# Patient Record
Sex: Male | Born: 1995 | Race: White | Hispanic: No | Marital: Single | Smoking: Never smoker
Health system: Southern US, Community
[De-identification: ages and names within clinical notes are randomized; demographics above are authoritative.]

---
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
## 2016-03-08 ENCOUNTER — Emergency Department (HOSPITAL_COMMUNITY)
Admission: EM | Admit: 2016-03-08 | Discharge: 2016-03-08 | Disposition: A | Discharge status: Home / Self Care | Payer: Managed Care, Other (non HMO) | Attending: Emergency Medicine | Admitting: Emergency Medicine

## 2016-03-08 ENCOUNTER — Encounter (HOSPITAL_COMMUNITY): Payer: Self-pay | Admitting: *Deleted

## 2016-03-08 DIAGNOSIS — K002 Abnormalities of size and form of teeth: Secondary | ICD-10-CM | POA: Diagnosis not present

## 2016-03-08 DIAGNOSIS — K029 Dental caries, unspecified: Secondary | ICD-10-CM

## 2016-03-08 DIAGNOSIS — K0889 Other specified disorders of teeth and supporting structures: Secondary | ICD-10-CM | POA: Diagnosis present

## 2016-03-08 MED ORDER — PENICILLIN V POTASSIUM 500 MG PO TABS: 500.0000 mg | ORAL_TABLET | Freq: Three times a day (TID) | ORAL | Status: AC

## 2016-03-08 MED ORDER — TRAMADOL HCL 50 MG PO TABS: 50.0000 mg | ORAL_TABLET | Freq: Four times a day (QID) | ORAL | Status: AC | PRN

## 2016-03-08 NOTE — ED Provider Notes (Signed)
CSN: [id number]     Arrival date & time 03/08/16  0418 History   None     HPI Comments: 20 year old male presents with dental pain. He states he has had pain in the right upper molar for the past month and a half however in the past week it has gotten worse. He states he has not seen a dentist in about 2 years. He reports drainage and a bad taste in his mouth. He has an appointment with the dentist in 4 days. Denies fever, chills, facial swelling.   Patient is a 20 y.o. male presenting with tooth pain.  Dental Pain Associated symptoms: no fever     History reviewed. No pertinent past medical history. History reviewed. No pertinent past surgical history. No family history on file. Social History  Substance Use Topics  . Smoking status: Never Smoker   . Smokeless tobacco: None  . Alcohol Use: No    Review of Systems  Constitutional: Negative for fever and chills.  HENT: Positive for dental problem.   All other systems reviewed and are negative.   Allergies  Review of patient's allergies indicates no known allergies.  Home Medications   Prior to Admission medications   Medication Sig Start Date End Date Taking? Authorizing Provider  penicillin v potassium (VEETID) 500 MG tablet Take 1 tablet (500 mg total) by mouth 3 (three) times daily. 03/08/16   Bethel BornKelly Marie Gekas, PA-C  traMADol (ULTRAM) 50 MG tablet Take 1 tablet (50 mg total) by mouth every 6 (six) hours as needed. 03/08/16   Bethel BornKelly Marie Gekas, PA-C   BP 127/82 mmHg  Pulse 64  Temp(Src) 97.9 F (36.6 C) (Oral)  Resp 18  Ht 5\' 9"  (1.753 m)  Wt 70.308 kg  BMI 22.88 kg/m2  SpO2 99%   Physical Exam  Constitutional: He is oriented to person, place, and time. He appears well-developed and well-nourished. No distress.  HENT:  Head: Normocephalic and atraumatic.  Mouth/Throat: Uvula is midline, oropharynx is clear and moist and mucous membranes are normal. Abnormal dentition. Dental caries present. No dental abscesses or  uvula swelling.    Eyes: Conjunctivae are normal. Pupils are equal, round, and reactive to light. Right eye exhibits no discharge. Left eye exhibits no discharge. No scleral icterus.  Neck: Normal range of motion.  Pulmonary/Chest: Effort normal. No respiratory distress.  Neurological: He is alert and oriented to person, place, and time.  Skin: Skin is warm and dry.  Psychiatric: He has a normal mood and affect.  Nursing note and vitals reviewed.   ED Course  Procedures (including critical care time) Labs Review Labs Reviewed - No data to display  Imaging Review No results found. I have personally reviewed and evaluated these images and lab results as part of my medical decision-making.   EKG Interpretation None      MDM   Final diagnoses:  Pain due to dental caries   20 year old male presents with dental pain. His pain is most likely due to his caries. No abscess visualized. There was minimal redness and swelling of the gingiva. Patient has been taking Ibuprofen 800mg  TID without relief. Will give Ultram and cover infection with PCN until he can see a dentist. Patient is NAD, non-toxic, with stable VS. Patient is informed of clinical course, understands medical decision making process, and agrees with plan. Opportunity for questions provided and all questions answered.     Bethel BornKelly Marie Gekas, PA-C 03/08/16 [id number]  Dione Boozeavid Glick, MD  03/08/16 0814 

## 2016-03-08 NOTE — ED Notes (Signed)
C/o right upper dental pain

## 2016-03-08 NOTE — Discharge Instructions (Signed)
